# Patient Record
Sex: Male | Born: 1990
Health system: Southern US, Community
[De-identification: ages and names within clinical notes are randomized; demographics above are authoritative.]

## PROBLEM LIST (undated history)

## (undated) DIAGNOSIS — J45909 Unspecified asthma, uncomplicated: Secondary | ICD-10-CM

## (undated) HISTORY — PX: ABDOMINAL SURGERY: SHX537

---
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
## 2012-12-11 ENCOUNTER — Encounter (HOSPITAL_BASED_OUTPATIENT_CLINIC_OR_DEPARTMENT_OTHER): Payer: Self-pay | Admitting: Emergency Medicine

## 2012-12-11 ENCOUNTER — Emergency Department (HOSPITAL_BASED_OUTPATIENT_CLINIC_OR_DEPARTMENT_OTHER): Payer: Self-pay

## 2012-12-11 ENCOUNTER — Emergency Department (HOSPITAL_BASED_OUTPATIENT_CLINIC_OR_DEPARTMENT_OTHER)
Admission: EM | Admit: 2012-12-11 | Discharge: 2012-12-11 | Disposition: A | Discharge status: Home / Self Care | Payer: Self-pay | Attending: Emergency Medicine | Admitting: Emergency Medicine

## 2012-12-11 DIAGNOSIS — Z87891 Personal history of nicotine dependence: Secondary | ICD-10-CM | POA: Insufficient documentation

## 2012-12-11 DIAGNOSIS — R197 Diarrhea, unspecified: Secondary | ICD-10-CM | POA: Insufficient documentation

## 2012-12-11 DIAGNOSIS — Z9889 Other specified postprocedural states: Secondary | ICD-10-CM | POA: Insufficient documentation

## 2012-12-11 DIAGNOSIS — R109 Unspecified abdominal pain: Secondary | ICD-10-CM | POA: Insufficient documentation

## 2012-12-11 LAB — COMPREHENSIVE METABOLIC PANEL
ALT: 7 U/L (ref 0–53)
AST: 24 U/L (ref 0–37)
Albumin: 3.9 g/dL (ref 3.5–5.2)
Alkaline Phosphatase: 79 U/L (ref 39–117)
BUN: 12 mg/dL (ref 6–23)
CO2: 23 mEq/L (ref 19–32)
Calcium: 9 mg/dL (ref 8.4–10.5)
Chloride: 102 mEq/L (ref 96–112)
Creatinine, Ser: 1.2 mg/dL (ref 0.50–1.35)
GFR calc Af Amer: >90 mL/min (ref >90)
GFR calc non Af Amer: 85 mL/min — ABNORMAL LOW (ref >90)
Glucose, Bld: 110 mg/dL — ABNORMAL HIGH (ref 70–99)
Potassium: 3.8 mEq/L (ref 3.5–5.1)
Sodium: 135 mEq/L (ref 135–145)
Total Bilirubin: 0.4 mg/dL (ref 0.3–1.2)
Total Protein: 7.7 g/dL (ref 6.0–8.3)

## 2012-12-11 LAB — URINALYSIS, ROUTINE W REFLEX MICROSCOPIC
Bilirubin Urine: NEGATIVE
Glucose, UA: NEGATIVE mg/dL
Hgb urine dipstick: NEGATIVE
Ketones, ur: NEGATIVE mg/dL
Leukocytes, UA: NEGATIVE
Nitrite: NEGATIVE
Protein, ur: NEGATIVE mg/dL
Specific Gravity, Urine: 1.018 (ref 1.005–1.030)
Urobilinogen, UA: 0.2 mg/dL (ref 0.0–1.0)
pH: 5.5 (ref 5.0–8.0)

## 2012-12-11 LAB — CBC WITH DIFFERENTIAL/PLATELET
Basophils Absolute: 0 10*3/uL (ref 0.0–0.1)
Basophils Relative: 0 % (ref 0–1)
Eosinophils Absolute: 0.3 10*3/uL (ref 0.0–0.7)
Eosinophils Relative: 4 % (ref 0–5)
HCT: 43 % (ref 39.0–52.0)
Hemoglobin: 15 g/dL (ref 13.0–17.0)
Lymphocytes Relative: 31 % (ref 12–46)
Lymphs Abs: 2.2 10*3/uL (ref 0.7–4.0)
MCH: 28.7 pg (ref 26.0–34.0)
MCHC: 34.9 g/dL (ref 30.0–36.0)
MCV: 82.2 fL (ref 78.0–100.0)
Monocytes Absolute: 0.8 10*3/uL (ref 0.1–1.0)
Monocytes Relative: 11 % (ref 3–12)
Neutro Abs: 3.9 10*3/uL (ref 1.7–7.7)
Neutrophils Relative %: 54 % (ref 43–77)
Platelets: 292 10*3/uL (ref 150–400)
RBC: 5.23 MIL/uL (ref 4.22–5.81)
RDW: 11 % — ABNORMAL LOW (ref 11.5–15.5)
WBC: 7.3 10*3/uL (ref 4.0–10.5)

## 2012-12-11 LAB — LIPASE, BLOOD: Lipase: 32 U/L (ref 11–59)

## 2012-12-11 MED ORDER — GI COCKTAIL ~~LOC~~
30.0000 mL | Freq: Once | ORAL | Status: AC
  Administered 2012-12-11: 30 mL via ORAL

## 2012-12-11 MED ORDER — GI COCKTAIL ~~LOC~~
ORAL | Status: AC
  Filled 2012-12-11: qty 30

## 2012-12-11 MED ORDER — IOHEXOL 300 MG/ML  SOLN
50.0000 mL | Freq: Once | INTRAMUSCULAR | Status: AC | PRN
  Administered 2012-12-11: 50 mL via ORAL

## 2012-12-11 MED ORDER — LANSOPRAZOLE 30 MG PO CPDR: 30.0000 mg | DELAYED_RELEASE_CAPSULE | Freq: Every day | ORAL | Status: AC

## 2012-12-11 MED ORDER — IOHEXOL 300 MG/ML  SOLN
100.0000 mL | Freq: Once | INTRAMUSCULAR | Status: AC | PRN
  Administered 2012-12-11: 100 mL via INTRAVENOUS

## 2012-12-11 NOTE — ED Notes (Signed)
Pt. Reports mid abd. Pain started approx. 4 days ago.  No vomiting and reports soft stools more often than normal.

## 2012-12-11 NOTE — ED Provider Notes (Signed)
CSN: [id number]     Arrival date & time 12/11/12  0037 History   First MD Initiated Contact with Patient 12/11/12 0058     Chief Complaint  Patient presents with  . Abdominal Pain   (Consider location/radiation/quality/duration/timing/severity/associated sxs/prior Treatment) HPI Patient is a 22 year old male who presents with upper abdominal pain for the past 4 days. The pain is worse when he eating. He has had no nausea or vomiting. He has had several loose stools. He denies fever or chills. Patient states he was born premature and had an abdominal surgery as an infant but does not know the exact nature of the surgery. He states he took some ibuprofen for the pain which did not help. He periodically takes NSAIDs for headaches and minor pain.  History reviewed. No pertinent past medical history. Past Surgical History  Procedure Laterality Date  . Abdominal surgery      at birth per Pt.  intestinal surgery   No family history on file. History  Substance Use Topics  . Smoking status: Former Games developer  . Smokeless tobacco: Not on file  . Alcohol Use: No    Review of Systems  Constitutional: Negative for fever and chills.  Respiratory: Negative for shortness of breath.   Gastrointestinal: Positive for abdominal pain and diarrhea. Negative for nausea, vomiting, constipation and blood in stool.  Genitourinary: Negative for dysuria, frequency and flank pain.  Musculoskeletal: Negative for back pain.  Skin: Negative for rash and wound.  Neurological: Negative for dizziness, weakness, light-headedness, numbness and headaches.  All other systems reviewed and are negative.    Allergies  Review of patient's allergies indicates no known allergies.  Home Medications  No current outpatient prescriptions on file. BP 151/78  Pulse 76  Temp(Src) 98.4 F (36.9 C) (Oral)  Resp 18  Ht 5\' 7"  (1.702 m)  Wt 181 lb (82.101 kg)  BMI 28.34 kg/m2  SpO2 98% Physical Exam  Nursing note and vitals  reviewed. Constitutional: He is oriented to person, place, and time. He appears well-developed and well-nourished. No distress.  HENT:  Head: Normocephalic and atraumatic.  Mouth/Throat: Oropharynx is clear and moist.  Eyes: EOM are normal. Pupils are equal, round, and reactive to light.  Neck: Normal range of motion. Neck supple.  Cardiovascular: Normal rate and regular rhythm.   Pulmonary/Chest: Effort normal and breath sounds normal. No respiratory distress. He has no wheezes. He has no rales.  Abdominal: Soft. Bowel sounds are normal. He exhibits distension (mild abdominal distention.). He exhibits no mass. There is tenderness (patient with tenderness to palpation in the epigastric region. No rebound or guarding.). There is no rebound and no guarding.  Musculoskeletal: Normal range of motion. He exhibits no edema and no tenderness.  Neurological: He is alert and oriented to person, place, and time.  Skin: Skin is warm and dry. No rash noted. No erythema.  Psychiatric: He has a normal mood and affect. His behavior is normal.    ED Course  Procedures (including critical care time) Labs Review Labs Reviewed  URINALYSIS, ROUTINE W REFLEX MICROSCOPIC   Imaging Review No results found.  EKG Interpretation   None       MDM    Patient is resting comfortably. Abdomen remained remained soft and there is mild improvement with the GI cocktail. CT shows nonspecific changes. Patient been informed of these results and advised to followup with a gastroenterologist should his symptoms persist. Question whether symptoms related to gastritis. Will start on PPI and advise over-the-counter  Mylanta. Patient urged to return for worsening pain, persistent vomiting, fever, blood in stool or for any concerns.  Loren Racer, MD 12/11/12 [phone]

## 2012-12-11 NOTE — ED Notes (Signed)
Patient transported to X-ray 

## 2012-12-11 NOTE — ED Notes (Signed)
Patient transported to CT 

## 2012-12-11 NOTE — ED Notes (Signed)
MD at bedside. 

## 2013-11-20 ENCOUNTER — Encounter (HOSPITAL_BASED_OUTPATIENT_CLINIC_OR_DEPARTMENT_OTHER): Payer: Self-pay | Admitting: *Deleted

## 2013-11-20 ENCOUNTER — Emergency Department (HOSPITAL_BASED_OUTPATIENT_CLINIC_OR_DEPARTMENT_OTHER)
Admission: EM | Admit: 2013-11-20 | Discharge: 2013-11-20 | Disposition: A | Discharge status: Home / Self Care | Payer: Self-pay | Attending: Emergency Medicine | Admitting: Emergency Medicine

## 2013-11-20 ENCOUNTER — Emergency Department (HOSPITAL_BASED_OUTPATIENT_CLINIC_OR_DEPARTMENT_OTHER): Payer: Self-pay

## 2013-11-20 DIAGNOSIS — Z79899 Other long term (current) drug therapy: Secondary | ICD-10-CM | POA: Insufficient documentation

## 2013-11-20 DIAGNOSIS — Z87891 Personal history of nicotine dependence: Secondary | ICD-10-CM | POA: Insufficient documentation

## 2013-11-20 DIAGNOSIS — R52 Pain, unspecified: Secondary | ICD-10-CM

## 2013-11-20 DIAGNOSIS — M25522 Pain in left elbow: Secondary | ICD-10-CM

## 2013-11-20 DIAGNOSIS — Y9389 Activity, other specified: Secondary | ICD-10-CM | POA: Insufficient documentation

## 2013-11-20 DIAGNOSIS — Y9289 Other specified places as the place of occurrence of the external cause: Secondary | ICD-10-CM | POA: Insufficient documentation

## 2013-11-20 DIAGNOSIS — S59902A Unspecified injury of left elbow, initial encounter: Secondary | ICD-10-CM | POA: Insufficient documentation

## 2013-11-20 DIAGNOSIS — W2209XA Striking against other stationary object, initial encounter: Secondary | ICD-10-CM | POA: Insufficient documentation

## 2013-11-20 MED ORDER — TRAMADOL HCL 50 MG PO TABS: 50.0000 mg | ORAL_TABLET | Freq: Four times a day (QID) | ORAL | Status: AC | PRN

## 2013-11-20 NOTE — ED Provider Notes (Signed)
CSN: [id number]     Arrival date & time 11/20/13  1435 History   First MD Initiated Contact with Patient 11/20/13 1446     Chief Complaint  Patient presents with  . Elbow Injury     (Consider location/radiation/quality/duration/timing/severity/associated sxs/prior Treatment) HPI Comments: Pt comes in today with pain to his right shoulder after hitting it on a window pain. Pt states that he is getting shooting pains doing his  Forearm on the lateral side. He states that he thinks it is a little swollen. Denies previous injury  The history is provided by the patient. No language interpreter was used.    History reviewed. No pertinent past medical history. Past Surgical History  Procedure Laterality Date  . Abdominal surgery      at birth per Pt.  intestinal surgery   No family history on file. History  Substance Use Topics  . Smoking status: Former Games developermoker  . Smokeless tobacco: Not on file  . Alcohol Use: Yes    Review of Systems  All other systems reviewed and are negative.     Allergies  Review of patient's allergies indicates no known allergies.  Home Medications   Prior to Admission medications   Medication Sig Start Date End Date Taking? Authorizing Provider  lansoprazole (PREVACID) 30 MG capsule Take 1 capsule (30 mg total) by mouth daily at 12 noon. 12/11/12   Loren Raceravid Yelverton, MD   BP 167/90 mmHg  Pulse 84  Temp(Src) 98.2 F (36.8 C) (Oral)  Resp 20  Ht 5\' 7"  (1.702 m)  Wt 205 lb (92.987 kg)  BMI 32.10 kg/m2  SpO2 99% Physical Exam  Constitutional: He is oriented to person, place, and time. He appears well-developed and well-nourished.  Cardiovascular: Normal rate and regular rhythm.   Pulmonary/Chest: Effort normal and breath sounds normal.  Musculoskeletal:  Tender in the upper right upper on the lateral side. Full rom. Pulses intact  Neurological: He is alert and oriented to person, place, and time.  Skin: Skin is warm and dry.  Nursing note and  vitals reviewed.   ED Course  Procedures (including critical care time) Labs Review Labs Reviewed - No data to display  Imaging Review Dg Elbow Complete Left  11/20/2013   CLINICAL DATA:  Left level pain after hitting window. Initial encounter  EXAM: LEFT ELBOW - COMPLETE 3+ VIEW  COMPARISON:  None.  FINDINGS: There is no evidence of fracture, dislocation, or joint effusion. There is no evidence of arthropathy or other focal bone abnormality. Soft tissues are unremarkable.  IMPRESSION: Negative.   Electronically Signed   By: Tiburcio PeaJonathan  Watts M.D.   On: 11/20/2013 15:16     EKG Interpretation None      MDM   Final diagnoses:  Pain  Elbow pain, left    No bony abnormality noted. Will treat with ultram for pain. Given referral to DR. Vivi Barrackhudnall    Raeann Offner, NP 11/20/13 1523  Purvis SheffieldForrest Harrison, MD 11/20/13 1526

## 2013-11-20 NOTE — Discharge Instructions (Signed)
You can get a band at the pharmacy for continued symptoms Arthralgia Your caregiver has diagnosed you as suffering from an arthralgia. Arthralgia means there is pain in a joint. This can come from many reasons including:  Bruising the joint which causes soreness (inflammation) in the joint.  Wear and tear on the joints which occur as we grow older (osteoarthritis).  Overusing the joint.  Various forms of arthritis.  Infections of the joint. Regardless of the cause of pain in your joint, most of these different pains respond to anti-inflammatory drugs and rest. The exception to this is when a joint is infected, and these cases are treated with antibiotics, if it is a bacterial infection. HOME CARE INSTRUCTIONS   Rest the injured area for as long as directed by your caregiver. Then slowly start using the joint as directed by your caregiver and as the pain allows. Crutches as directed may be useful if the ankles, knees or hips are involved. If the knee was splinted or casted, continue use and care as directed. If an stretchy or elastic wrapping bandage has been applied today, it should be removed and re-applied every 3 to 4 hours. It should not be applied tightly, but firmly enough to keep swelling down. Watch toes and feet for swelling, bluish discoloration, coldness, numbness or excessive pain. If any of these problems (symptoms) occur, remove the ace bandage and re-apply more loosely. If these symptoms persist, contact your caregiver or return to this location.  For the first 24 hours, keep the injured extremity elevated on pillows while lying down.  Apply ice for 15-20 minutes to the sore joint every couple hours while awake for the first half day. Then 03-04 times per day for the first 48 hours. Put the ice in a plastic bag and place a towel between the bag of ice and your skin.  Wear any splinting, casting, elastic bandage applications, or slings as instructed.  Only take over-the-counter  or prescription medicines for pain, discomfort, or fever as directed by your caregiver. Do not use aspirin immediately after the injury unless instructed by your physician. Aspirin can cause increased bleeding and bruising of the tissues.  If you were given crutches, continue to use them as instructed and do not resume weight bearing on the sore joint until instructed. Persistent pain and inability to use the sore joint as directed for more than 2 to 3 days are warning signs indicating that you should see a caregiver for a follow-up visit as soon as possible. Initially, a hairline fracture (break in bone) may not be evident on X-rays. Persistent pain and swelling indicate that further evaluation, non-weight bearing or use of the joint (use of crutches or slings as instructed), or further X-rays are indicated. X-rays may sometimes not show a small fracture until a week or 10 days later. Make a follow-up appointment with your own caregiver or one to whom we have referred you. A radiologist (specialist in reading X-rays) may read your X-rays. Make sure you know how you are to obtain your X-ray results. Do not assume everything is normal if you do not hear from us. SEEK MEDICAL CARE IF: Bruising, swelling, or pain increases. SEEK IMMEDIATE MEDICAL CARE IF:   Your fingers or toes are numb or blue.  The pain is not responding to medications and continues to stay the same or get worse.  The pain in your joint becomes severe.  You develop a fever over 102 F (38.9 C).  It becomes  impossible to move or use the joint. MAKE SURE YOU:   Understand these instructions.  Will watch your condition.  Will get help right away if you are not doing well or get worse. Document Released: 01/02/2005 Document Revised: 03/27/2011 Document Reviewed: 08/21/2007 [facility] Patient Information 2015 [employer], [place]. This information is not intended to replace advice given to you by your health care provider. Make sure you  discuss any questions you have with your health care provider.

## 2013-11-20 NOTE — ED Notes (Signed)
Hit his left elbow on a window pain 40 minutes ago. Painful.

## 2014-10-17 IMAGING — CR DG ABDOMEN ACUTE W/ 1V CHEST
3 series · 3 of 3 positions shown · non-contrast
Comparison: None.

CLINICAL DATA: Epigastric pain with nausea and diarrhea for 4 days.

EXAM:
ACUTE ABDOMEN SERIES (ABDOMEN 2 VIEW & CHEST 1 VIEW)

[w chest pa]
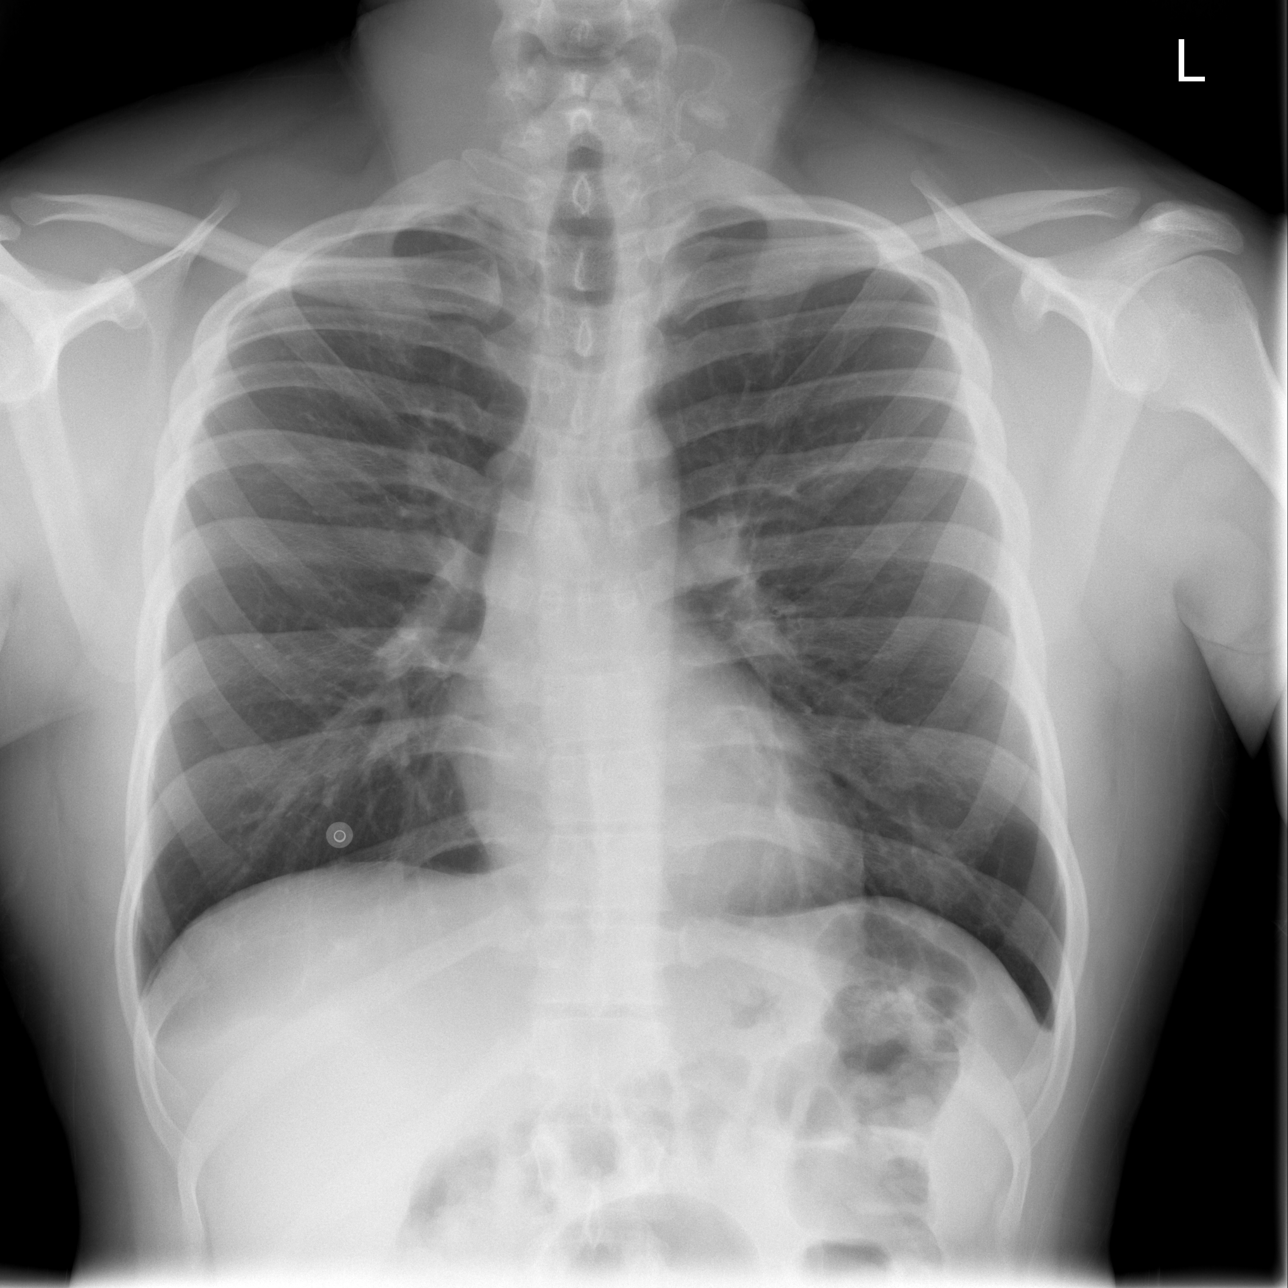

[w abdomen upright]
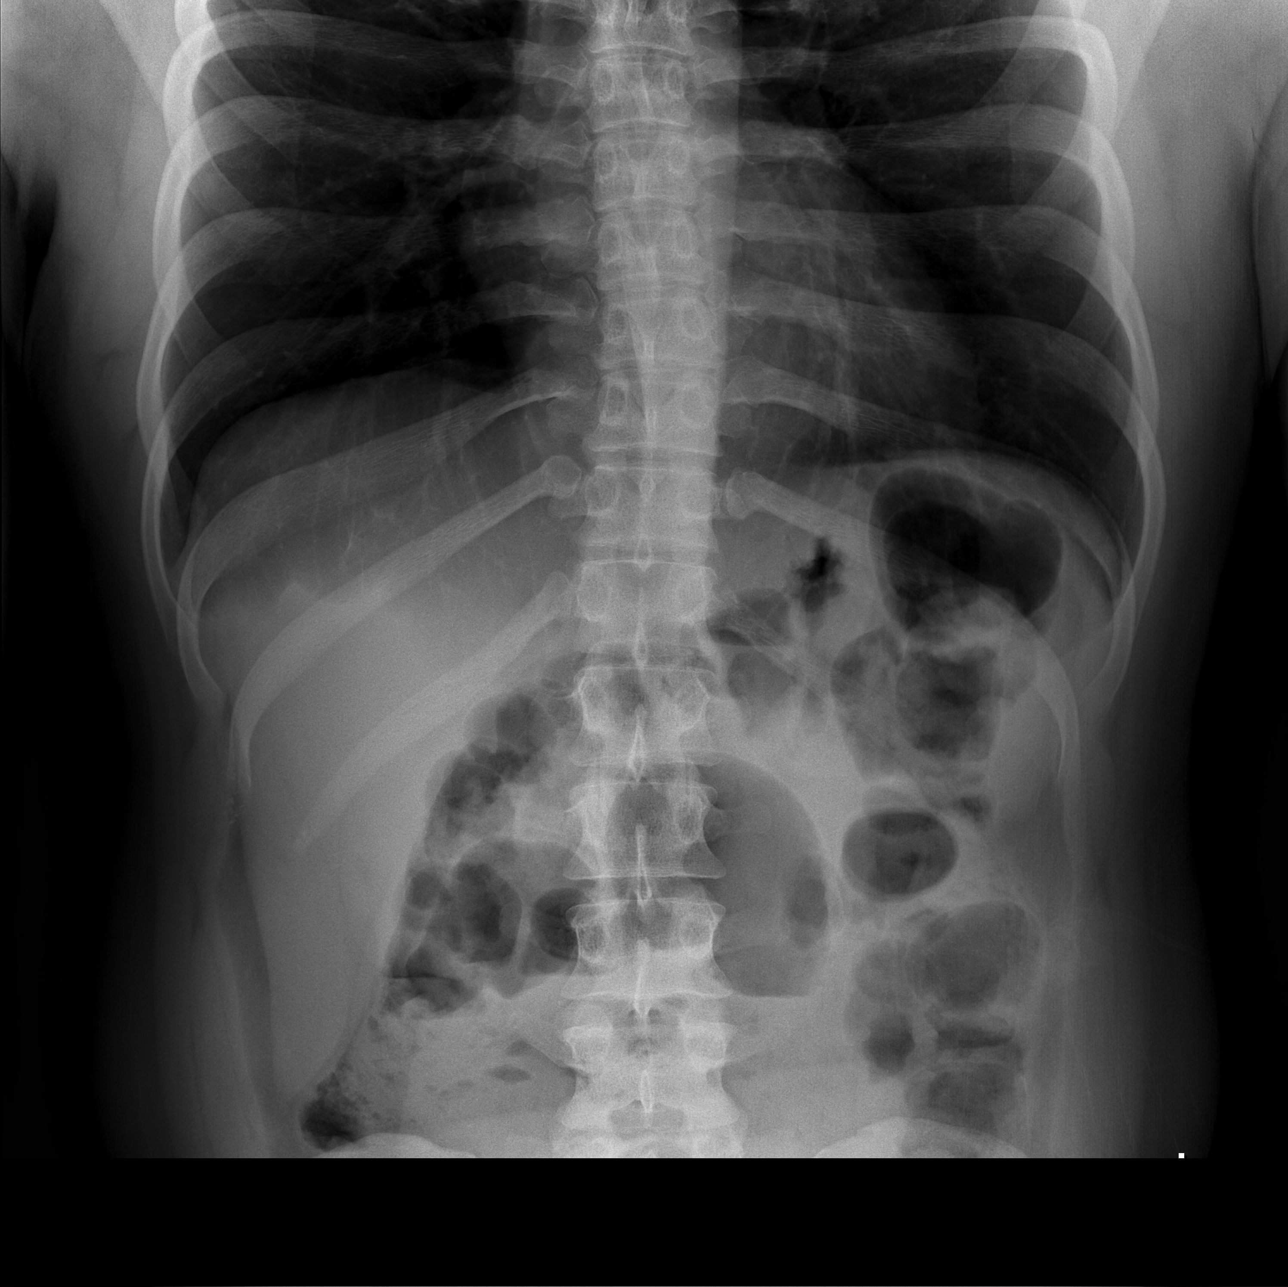

[t abdomen supine]
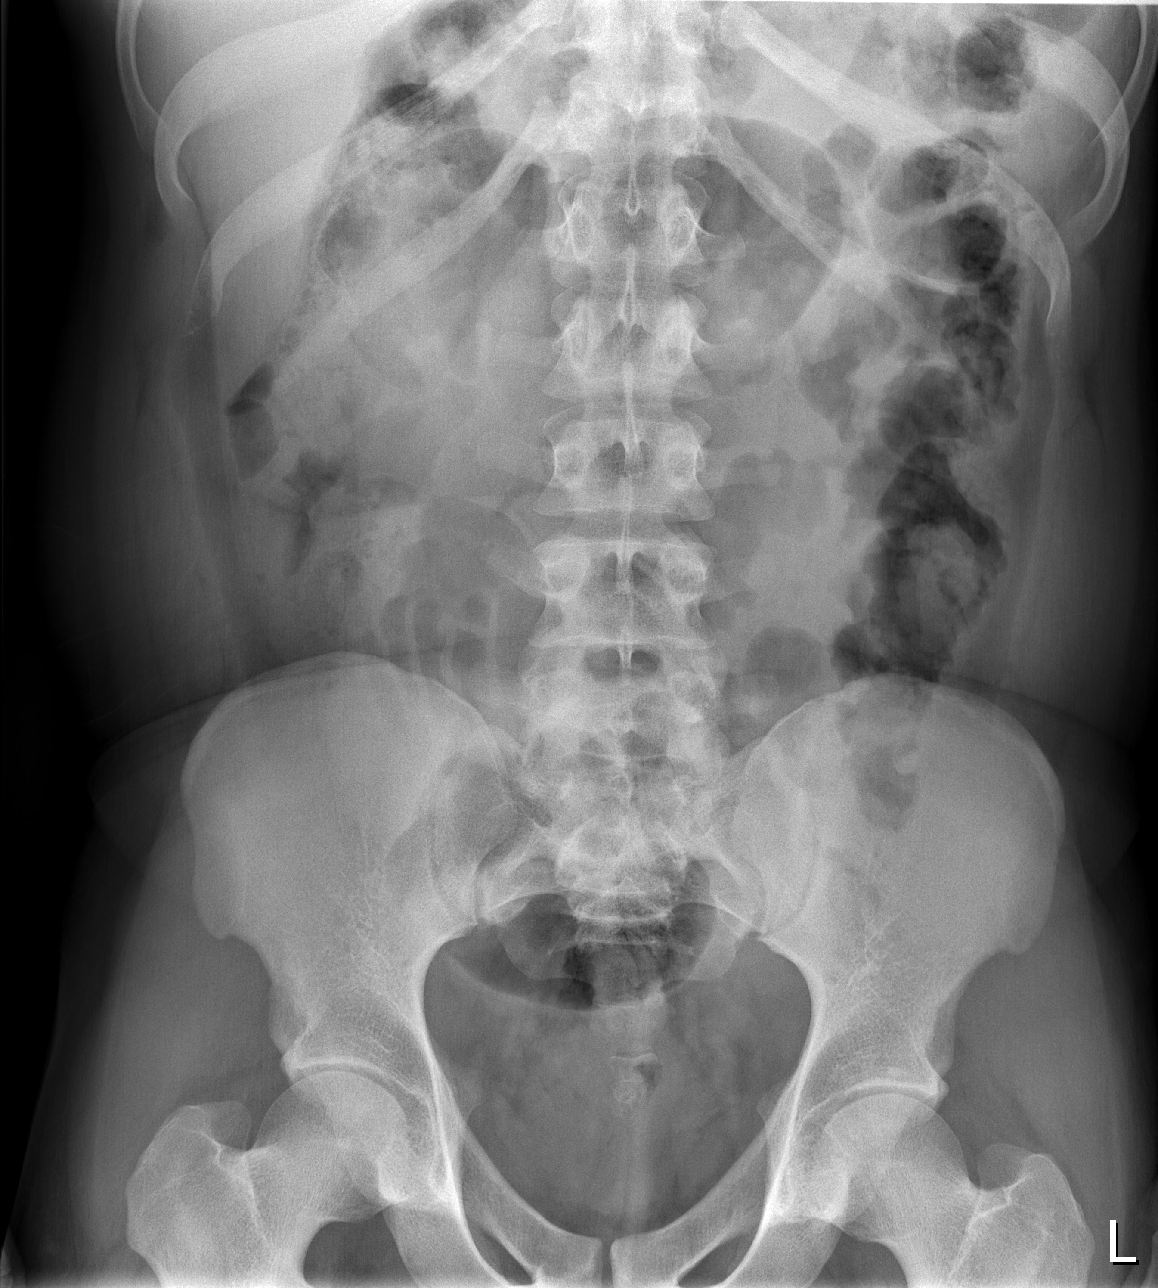

[3 of 3 positions shown; findings below may reference images not displayed]

FINDINGS: Normal heart size and pulmonary vascularity. No focal airspace
disease or consolidation in the lungs. No blunting of costophrenic
angles.

Gas and stool in the colon with gas-filled small bowel loops
centrally. No small or large bowel distention. No free
intra-abdominal air. There a few mid abdominal air-fluid levels,
likely within the sigmoid colon and suggesting liquid stool. The
colonic bowel gas pattern in the descending colon suggests colonic
wall thickening. Changes may be due to colitis. No radiopaque
stones. Visualized bones appear intact.
IMPRESSION: No evidence of active pulmonary disease. Nonobstructive bowel gas
pattern. Suggestion of liquid stool in the sigmoid colon. Suggestion
of thickening of the wall of the descending colon which may indicate
evidence of colitis.

## 2015-05-20 ENCOUNTER — Emergency Department (HOSPITAL_BASED_OUTPATIENT_CLINIC_OR_DEPARTMENT_OTHER)
Admission: EM | Admit: 2015-05-20 | Discharge: 2015-05-20 | Disposition: A | Discharge status: Home / Self Care | Payer: Self-pay | Attending: Emergency Medicine | Admitting: Emergency Medicine

## 2015-05-20 ENCOUNTER — Encounter (HOSPITAL_BASED_OUTPATIENT_CLINIC_OR_DEPARTMENT_OTHER): Payer: Self-pay | Admitting: *Deleted

## 2015-05-20 ENCOUNTER — Emergency Department (HOSPITAL_BASED_OUTPATIENT_CLINIC_OR_DEPARTMENT_OTHER): Payer: Self-pay

## 2015-05-20 DIAGNOSIS — M545 Low back pain: Secondary | ICD-10-CM | POA: Insufficient documentation

## 2015-05-20 DIAGNOSIS — Z87891 Personal history of nicotine dependence: Secondary | ICD-10-CM | POA: Insufficient documentation

## 2015-05-20 LAB — URINALYSIS, ROUTINE W REFLEX MICROSCOPIC
Bilirubin Urine: NEGATIVE
GLUCOSE, UA: NEGATIVE mg/dL
Hgb urine dipstick: NEGATIVE
KETONES UR: NEGATIVE mg/dL
Leukocytes, UA: NEGATIVE
Nitrite: NEGATIVE
PROTEIN: NEGATIVE mg/dL
Specific Gravity, Urine: 1.022 (ref 1.005–1.030)
pH: 6.5 (ref 5.0–8.0)

## 2015-05-20 MED ORDER — NAPROXEN 500 MG PO TABS: 500.0000 mg | ORAL_TABLET | Freq: Two times a day (BID) | ORAL | Status: AC

## 2015-05-20 MED FILL — NAPROXEN 500 MG TABLET: 500 | 15 days supply | Qty: 30 | Fill #0

## 2015-05-20 NOTE — Discharge Instructions (Signed)

## 2015-05-20 NOTE — ED Notes (Signed)
Lower back pain x 3 months. States he feels it is from stretching.

## 2015-05-20 NOTE — ED Provider Notes (Signed)
CSN: [id number]     Arrival date & time 05/20/15  1352 History   First MD Initiated Contact with Patient 05/20/15 1440     Chief Complaint  Patient presents with  . Back Pain     (Consider location/radiation/quality/duration/timing/severity/associated sxs/prior Treatment) HPI Comments: Patient reports lower back pain ongoing for 4-5 months. States pain is constant. He has not taken anything for it. Pain is severe and unrelenting. Nothing makes it better or worse.  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 Denies any radiation of the pain down his legs does wrap around his side. No focal weakness, numbness or tingling. Comments. No fever. No IV drug abuse. No history of cancer. He is not taking anything for this pain other than marijuana. No bowel or bladder incontinence.  Patient is a 25 y.o. male presenting with back pain. The history is provided by the patient.  Back Pain Associated symptoms: no abdominal pain, no chest pain, no dysuria, no fever, no headaches and no weakness     History reviewed. No pertinent past medical history. Past Surgical History  Procedure Laterality Date  . Abdominal surgery      at birth per Pt.  intestinal surgery   No family history on file. Social History  Substance Use Topics  . Smoking status: Former Games developermoker  . Smokeless tobacco: None  . Alcohol Use: Yes    Review of Systems  Constitutional: Negative for fever, activity change and appetite change.  HENT: Negative for congestion and rhinorrhea.   Respiratory: Negative for cough, chest tightness and shortness of breath.   Cardiovascular: Negative for chest pain.  Gastrointestinal: Negative for nausea, vomiting and abdominal pain.  Genitourinary: Negative for dysuria, urgency, hematuria, decreased urine volume and testicular pain.  Musculoskeletal: Positive for back pain. Negative for myalgias.  Skin: Negative for rash.  Neurological: Negative for dizziness, weakness,  light-headedness and headaches.  A complete 10 system review of systems was obtained and all systems are negative except as noted in the HPI and PMH.      Allergies  Review of patient's allergies indicates no known allergies.  Home Medications   Prior to Admission medications   Medication Sig Start Date End Date Taking? Authorizing Provider  lansoprazole (PREVACID) 30 MG capsule Take 1 capsule (30 mg total) by mouth daily at 12 noon. 12/11/12   Loren Raceravid Yelverton, MD  naproxen (NAPROSYN) 500 MG tablet Take 1 tablet (500 mg total) by mouth 2 (two) times daily. 05/20/15   Glynn OctaveStephen Cabell Lazenby, MD  traMADol (ULTRAM) 50 MG tablet Take 1 tablet (50 mg total) by mouth every 6 (six) hours as needed. 11/20/13   Teressa LowerVrinda Pickering, NP   BP 135/75 mmHg  Pulse 75  Temp(Src) 98.1 F (36.7 C) (Oral)  Resp 18  Ht 5\' 7"  (1.702 m)  Wt 205 lb (92.987 kg)  BMI 32.10 kg/m2  SpO2 98% Physical Exam  Constitutional: He is oriented to person, place, and time. He appears well-developed and well-nourished. No distress.  HENT:  Head: Normocephalic and atraumatic.  Mouth/Throat: Oropharynx is clear and moist. No oropharyngeal exudate.  Eyes: Conjunctivae and EOM are normal. Pupils are equal, round, and reactive to light.  Neck: Normal range of motion. Neck supple.  No meningismus.  Cardiovascular: Normal rate, regular rhythm, normal heart sounds and intact distal pulses.   No murmur heard. Pulmonary/Chest: Effort normal and breath sounds normal. No respiratory distress.  Abdominal: Soft. There is no tenderness. There is no rebound and no guarding.  Musculoskeletal: Normal range of motion. He exhibits tenderness. He exhibits no edema.  Diffuse paraspinal lumbar tenderness  5/5 strength in bilateral lower extremities. Ankle plantar and dorsiflexion intact. Great toe extension intact bilaterally. +2 DP and PT pulses. +2 patellar reflexes bilaterally. Normal gait.   Neurological: He is alert and oriented to person,  place, and time. No cranial nerve deficit. He exhibits normal muscle tone. Coordination normal.  No ataxia on finger to nose bilaterally. No pronator drift. 5/5 strength throughout. CN 2-12 intact.Equal grip strength. Sensation intact.   Skin: Skin is warm.  Psychiatric: He has a normal mood and affect. His behavior is normal.  Nursing note and vitals reviewed.   ED Course  Procedures (including critical care time) Labs Review Labs Reviewed  URINALYSIS, ROUTINE W REFLEX MICROSCOPIC (NOT AT [facility])    Imaging Review Dg Lumbar Spine Complete  05/20/2015  CLINICAL DATA:  Pt c/o lower back pain for 3 weeks, pt states he felt a pull and a pop about 3 weeks ago and now his back feels strained, tired and painful all the time EXAM: LUMBAR SPINE - COMPLETE 4+ VIEW COMPARISON:  CT, 12/11/2012 FINDINGS: There is no evidence of lumbar spine fracture. Alignment is normal. Intervertebral disc spaces are maintained. IMPRESSION: Negative. Electronically Signed   By: Amie Portland M.D.   On: 05/20/2015 15:37   I have personally reviewed and evaluated these images and lab results as part of my medical decision-making.   EKG Interpretation None      MDM   Final diagnoses:  Low back pain without sciatica, unspecified back pain laterality   4 months of constant low back pain without trauma. No focal neurological deficits. No incontinence.  Normal neurological exam. Urinalysis obtained in triage is negative. X-rays negative.  Treat supportively for Musculoskeletal back pain. No evidence of cord compression or cauda equina. Establish care with PCP. Return precautions discussed.    Glynn Octave, MD 05/20/15 [phone]

## 2015-08-08 ENCOUNTER — Emergency Department (HOSPITAL_BASED_OUTPATIENT_CLINIC_OR_DEPARTMENT_OTHER)
Admission: EM | Admit: 2015-08-08 | Discharge: 2015-08-09 | Disposition: A | Discharge status: Home / Self Care | Payer: No Typology Code available for payment source | Attending: Emergency Medicine | Admitting: Emergency Medicine

## 2015-08-08 ENCOUNTER — Encounter (HOSPITAL_BASED_OUTPATIENT_CLINIC_OR_DEPARTMENT_OTHER): Payer: Self-pay | Admitting: *Deleted

## 2015-08-08 DIAGNOSIS — Z87891 Personal history of nicotine dependence: Secondary | ICD-10-CM | POA: Insufficient documentation

## 2015-08-08 DIAGNOSIS — M546 Pain in thoracic spine: Secondary | ICD-10-CM | POA: Insufficient documentation

## 2015-08-08 DIAGNOSIS — Y939 Activity, unspecified: Secondary | ICD-10-CM | POA: Diagnosis not present

## 2015-08-08 DIAGNOSIS — Y92410 Unspecified street and highway as the place of occurrence of the external cause: Secondary | ICD-10-CM | POA: Insufficient documentation

## 2015-08-08 DIAGNOSIS — M25562 Pain in left knee: Secondary | ICD-10-CM | POA: Diagnosis not present

## 2015-08-08 DIAGNOSIS — M542 Cervicalgia: Secondary | ICD-10-CM | POA: Diagnosis not present

## 2015-08-08 DIAGNOSIS — Y999 Unspecified external cause status: Secondary | ICD-10-CM | POA: Diagnosis not present

## 2015-08-08 NOTE — ED Notes (Signed)
Pt called for triage x2 
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
## 2015-08-09 ENCOUNTER — Emergency Department (HOSPITAL_BASED_OUTPATIENT_CLINIC_OR_DEPARTMENT_OTHER): Payer: No Typology Code available for payment source

## 2015-08-09 MED ORDER — NAPROXEN 250 MG PO TABS
500.0000 mg | ORAL_TABLET | Freq: Once | ORAL | Status: AC
  Administered 2015-08-09: 500 mg via ORAL
  Filled 2015-08-09: qty 2

## 2015-08-09 MED ORDER — OXYCODONE-ACETAMINOPHEN 5-325 MG PO TABS
1.0000 | ORAL_TABLET | Freq: Once | ORAL | Status: AC
  Administered 2015-08-09: 1 via ORAL
  Filled 2015-08-09: qty 1
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
## 2017-12-08 ENCOUNTER — Other Ambulatory Visit: Payer: Self-pay

## 2017-12-08 ENCOUNTER — Encounter (HOSPITAL_BASED_OUTPATIENT_CLINIC_OR_DEPARTMENT_OTHER): Payer: Self-pay | Admitting: Emergency Medicine

## 2017-12-08 ENCOUNTER — Emergency Department (HOSPITAL_BASED_OUTPATIENT_CLINIC_OR_DEPARTMENT_OTHER)
Admission: EM | Admit: 2017-12-08 | Discharge: 2017-12-08 | Disposition: A | Discharge status: Home / Self Care | Payer: Self-pay | Attending: Emergency Medicine | Admitting: Emergency Medicine

## 2017-12-08 DIAGNOSIS — Z87891 Personal history of nicotine dependence: Secondary | ICD-10-CM | POA: Insufficient documentation

## 2017-12-08 DIAGNOSIS — J45909 Unspecified asthma, uncomplicated: Secondary | ICD-10-CM | POA: Insufficient documentation

## 2017-12-08 DIAGNOSIS — Z79899 Other long term (current) drug therapy: Secondary | ICD-10-CM | POA: Insufficient documentation

## 2017-12-08 DIAGNOSIS — A084 Viral intestinal infection, unspecified: Secondary | ICD-10-CM | POA: Insufficient documentation

## 2017-12-08 HISTORY — DX: Unspecified asthma, uncomplicated: J45.909

## 2017-12-08 LAB — URINALYSIS, ROUTINE W REFLEX MICROSCOPIC
Bilirubin Urine: NEGATIVE
Glucose, UA: NEGATIVE mg/dL
KETONES UR: NEGATIVE mg/dL
LEUKOCYTES UA: NEGATIVE
Nitrite: NEGATIVE
PH: 5.5 (ref 5.0–8.0)
Protein, ur: NEGATIVE mg/dL
SPECIFIC GRAVITY, URINE: 1.025 (ref 1.005–1.030)

## 2017-12-08 LAB — COMPREHENSIVE METABOLIC PANEL
ALT: 18 U/L (ref 0–44)
AST: 24 U/L (ref 15–41)
Albumin: 4.2 g/dL (ref 3.5–5.0)
Alkaline Phosphatase: 46 U/L (ref 38–126)
Anion gap: 8 (ref 5–15)
BUN: 16 mg/dL (ref 6–20)
CO2: 21 mmol/L — AB (ref 22–32)
Calcium: 9 mg/dL (ref 8.9–10.3)
Chloride: 104 mmol/L (ref 98–111)
Creatinine, Ser: 0.99 mg/dL (ref 0.61–1.24)
GFR calc Af Amer: >60 mL/min (ref >60)
GFR calc non Af Amer: >60 mL/min (ref >60)
Glucose, Bld: 131 mg/dL — ABNORMAL HIGH (ref 70–99)
Potassium: 4 mmol/L (ref 3.5–5.1)
SODIUM: 133 mmol/L — AB (ref 135–145)
Total Bilirubin: 0.6 mg/dL (ref 0.3–1.2)
Total Protein: 7.7 g/dL (ref 6.5–8.1)

## 2017-12-08 LAB — CBC
HCT: 50.5 % (ref 39.0–52.0)
Hemoglobin: 16.4 g/dL (ref 13.0–17.0)
MCH: 28.4 pg (ref 26.0–34.0)
MCHC: 32.5 g/dL (ref 30.0–36.0)
MCV: 87.4 fL (ref 80.0–100.0)
NRBC: 0 % (ref 0.0–0.2)
Platelets: 305 10*3/uL (ref 150–400)
RBC: 5.78 MIL/uL (ref 4.22–5.81)
RDW: 11.5 % (ref 11.5–15.5)
WBC: 6.6 10*3/uL (ref 4.0–10.5)

## 2017-12-08 LAB — URINALYSIS, MICROSCOPIC (REFLEX)

## 2017-12-08 LAB — LIPASE, BLOOD: LIPASE: 32 U/L (ref 11–51)

## 2017-12-08 MED ORDER — ONDANSETRON 4 MG PO TBDP: 4.0000 mg | ORAL_TABLET | Freq: Three times a day (TID) | ORAL | 0 refills | Status: AC | PRN

## 2017-12-08 MED ORDER — SODIUM CHLORIDE 0.9 % IV BOLUS
1000.0000 mL | Freq: Once | INTRAVENOUS | Status: AC
  Administered 2017-12-08: 1000 mL via INTRAVENOUS

## 2017-12-08 MED ORDER — MORPHINE SULFATE (PF) 2 MG/ML IV SOLN
2.0000 mg | Freq: Once | INTRAVENOUS | Status: AC
Start: 2017-12-08 — End: 2017-12-08
  Administered 2017-12-08: 2 mg via INTRAVENOUS
  Filled 2017-12-08: qty 1

## 2017-12-08 MED ORDER — ONDANSETRON HCL 4 MG/2ML IJ SOLN: 4.0000 mg | Freq: Once | INTRAMUSCULAR | Status: DC | PRN

## 2017-12-08 NOTE — ED Provider Notes (Signed)
[facility] EMERGENCY DEPARTMENT Provider Note   CSN: [id number] Arrival date & time: 12/08/17  1238     History   Chief Complaint Chief Complaint  Patient presents with  . Back Pain  . Emesis  . Diarrhea    HPI Patrick Avery is a 27 y.o. male presents today for concern of nausea/vomiting/diarrhea abdominal pain and generalized body aches.  Patient states that after awakening this morning he developed nonbloody nonbilious vomiting shortly followed by nonbloody diarrhea.  Patient states that vomiting and diarrhea continued over the course of a few hours during which he developed abdominal pain.  He describes his abdominal pain as a cramping pain that is diffuse, waxes and wanes, worse with vomiting and diarrhea.  Patient states that his pain is nearly gone when he has not actively vomiting.  Patient has not taken medication for his symptoms prior to arrival.  Patient also endorses generalized body aches that began this morning around the same time as the vomiting and diarrhea.  Patient denies focal area of pain and states that his generalized body aches are mild in intensity.  Patient states that he ate a bag of chips just prior to arrival without difficulty, nausea or vomiting.  Patient denies history of fever, cough, congestion, rhinorrhea, sore throat, chest pain/shortness of breath, rash or any other concerning symptoms today.  Patient states that he is an otherwise healthy 27 year old male without history of chronic medical conditions or daily medication use.  HPI  Past Medical History:  Diagnosis Date  . Asthma     There are no active problems to display for this patient.   Past Surgical History:  Procedure Laterality Date  . ABDOMINAL SURGERY     at birth per Pt.  intestinal surgery        Home Medications    Prior to Admission medications   Medication Sig Start Date End Date Taking? Authorizing Provider  lansoprazole (PREVACID) 30 MG capsule Take  1 capsule (30 mg total) by mouth daily at 12 noon. 12/11/12   Loren RacerYelverton, David, MD  naproxen (NAPROSYN) 500 MG tablet Take 1 tablet (500 mg total) by mouth 2 (two) times daily. 05/20/15   Rancour, Jeannett SeniorStephen, MD  ondansetron (ZOFRAN ODT) 4 MG disintegrating tablet Take 1 tablet (4 mg total) by mouth every 8 (eight) hours as needed for nausea or vomiting. 12/08/17   Harlene SaltsMorelli, Jossie Smoot A, PA-C  traMADol (ULTRAM) 50 MG tablet Take 1 tablet (50 mg total) by mouth every 6 (six) hours as needed. 11/20/13   Teressa LowerPickering, Vrinda, NP    Family History No family history on file.  Social History Social History   Tobacco Use  . Smoking status: Former Games developermoker  . Smokeless tobacco: Never Used  Substance Use Topics  . Alcohol use: Yes  . Drug use: Yes    Types: Marijuana     Allergies   Patient has no known allergies.   Review of Systems Review of Systems  Constitutional: Negative.  Negative for chills and fever.  HENT: Negative.  Negative for rhinorrhea and sore throat.   Eyes: Negative.  Negative for visual disturbance.  Respiratory: Negative.  Negative for cough and shortness of breath.   Cardiovascular: Negative.  Negative for chest pain.  Gastrointestinal: Positive for abdominal pain, diarrhea, nausea and vomiting. Negative for blood in stool.  Genitourinary: Negative.  Negative for dysuria and hematuria.  Musculoskeletal: Positive for arthralgias and myalgias.  Skin: Negative.  Negative for rash.  Neurological: Negative.  Negative for  dizziness, weakness and headaches.   Physical Exam Updated Vital Signs BP 128/72 (BP Location: Left Arm)   Pulse 96   Temp 99.6 F (37.6 C) (Oral)   Resp 18   Ht 5\' 7"  (1.702 m)   Wt 81.6 kg   SpO2 99%   BMI 28.19 kg/m   Physical Exam  Constitutional: He appears well-developed and well-nourished. No distress.  HENT:  Head: Normocephalic and atraumatic.  Right Ear: Hearing, tympanic membrane, external ear and ear canal normal.  Left Ear: Hearing,  tympanic membrane, external ear and ear canal normal.  Nose: Rhinorrhea present.  Mouth/Throat: Uvula is midline, oropharynx is clear and moist and mucous membranes are normal.  Eyes: Pupils are equal, round, and reactive to light. Conjunctivae and EOM are normal.  Neck: Trachea normal, normal range of motion, full passive range of motion without pain and phonation normal. Neck supple. No tracheal deviation present.  Cardiovascular: Normal rate, regular rhythm, normal heart sounds and intact distal pulses.  Pulses:      Radial pulses are 2+ on the right side, and 2+ on the left side.       Dorsalis pedis pulses are 2+ on the right side, and 2+ on the left side.       Posterior tibial pulses are 2+ on the right side, and 2+ on the left side.  Pulmonary/Chest: Effort normal and breath sounds normal. No respiratory distress. He has no decreased breath sounds. He has no wheezes. He exhibits no tenderness, no crepitus and no deformity.  Abdominal: Soft. He exhibits no distension. There is generalized tenderness. There is no rigidity, no rebound, no guarding, no CVA tenderness, no tenderness at McBurney's point and negative Murphy's sign.    Well-healed laparotomy scar patient states is due to surgery when he was an infant.  Musculoskeletal: Normal range of motion.       Right lower leg: Normal.       Left lower leg: Normal.  Feet:  Right Foot:  Protective Sensation: 3 sites tested. 3 sites sensed.  Left Foot:  Protective Sensation: 3 sites tested. 3 sites sensed.  Neurological: He is alert. GCS eye subscore is 4. GCS verbal subscore is 5. GCS motor subscore is 6.  Speech is clear and goal oriented, follows commands Major Cranial nerves without deficit, no facial droop Normal strength in upper and lower extremities bilaterally including dorsiflexion and plantar flexion, strong and equal grip strength Sensation normal to light touch Moves extremities without ataxia, coordination intact Normal  gait  Skin: Skin is warm and dry.  Psychiatric: He has a normal mood and affect. His behavior is normal.   ED Treatments / Results  Labs (all labs ordered are listed, but only abnormal results are displayed) Labs Reviewed  COMPREHENSIVE METABOLIC PANEL - Abnormal; Notable for the following components:      Result Value   Sodium 133 (*)    CO2 21 (*)    Glucose, Bld 131 (*)    All other components within normal limits  URINALYSIS, ROUTINE W REFLEX MICROSCOPIC - Abnormal; Notable for the following components:   Hgb urine dipstick LARGE (*)    All other components within normal limits  URINALYSIS, MICROSCOPIC (REFLEX) - Abnormal; Notable for the following components:   Bacteria, UA MANY (*)    All other components within normal limits  LIPASE, BLOOD  CBC    EKG None  Radiology No results found.  Procedures Procedures (including critical care time)  Medications Ordered in ED  Medications  sodium chloride 0.9 % bolus 1,000 mL (1,000 mLs Intravenous New Bag/Given 12/08/17 1417)  morphine 2 MG/ML injection 2 mg (2 mg Intravenous Given 12/08/17 1417)     Initial Impression / Assessment and Plan / ED Course  I have reviewed the triage vital signs and the nursing notes.  Pertinent labs & imaging results that were available during my care of the patient were reviewed by me and considered in my medical decision making (see chart for details).  Clinical Course as of Dec 08 1529  Sat Dec 08, 2017  1512 Patient reevaluated resting comfortably no acute distress.  Patient did not receive Zofran.  Patient is eating graham crackers and drinking ginger ale without difficulty or recurrence of nausea or vomiting.  States that he is ready to be discharged at this time.   [BM]    Clinical Course User Index [BM] Bill Salinas, PA-C   27 year old otherwise healthy male presenting for nausea/vomiting/diarrhea, mild abdominal pain and generalized body aches that began this  morning.  CBC within normal limits CMP nonacute Lipase within normal limits Urinalysis with bacteria however without urinary symptoms, no nitrates or leukocytes, doubt UTI  Patient is afebrile, non-toxic appearing, sitting comfortably on examination table. Patient's mild pain adequately managed in emergency department. Fluid bolus given. Patient does not meet the SIRS or Sepsis criteria.  On repeat exam patient does not have a surgical abdomen and there are no peritoneal signs, no abdominal pain on repeat examination.  No indication of appendicitis, bowel obstruction, bowel perforation, cholecystitis, diverticulitis or other acute intra-abdominal processes.  With clinical history and presentation suspect likely viral gastroenteritis today.  Patient is tolerating p.o. without difficulty.  Will discharge patient with ODT Zofran as needed for nausea/vomiting, encourage fluids intake and rest.  Encourage PCP follow-up within 72 hours.  At this time there does not appear to be any evidence of an acute emergency medical condition and the patient appears stable for discharge with appropriate outpatient follow up. Diagnosis was discussed with patient who verbalizes understanding of care plan and is agreeable to discharge. I have discussed return precautions with patient and his father at bedside who verbalize understanding of return precautions. Patient strongly encouraged to follow-up with their PCP. All questions answered.  Patient's case discussed with Dr. Patria Mane who agrees with plan to discharge, zofran and follow-up.   Note: Portions of this report may have been transcribed using voice recognition software. Every effort was made to ensure accuracy; however, inadvertent computerized transcription errors may still be present. Final Clinical Impressions(s) / ED Diagnoses   Final diagnoses:  Viral gastroenteritis    ED Discharge Orders         Ordered    ondansetron (ZOFRAN ODT) 4 MG disintegrating  tablet  Every 8 hours PRN     12/08/17 1529           Elizabeth Palau 12/08/17 1536    Azalia Bilis, MD 12/09/17 [phone]

## 2017-12-08 NOTE — Discharge Instructions (Signed)
You have been diagnosed today with viral gastroenteritis.  At this time there does not appear to be the presence of an emergent medical condition, however there is always the potential for conditions to change. Please read and follow the below instructions.  Please return to the Emergency Department immediately for any new or worsening symptoms or if your symptoms do not improve in the next 3 days. Please be sure to follow up with your Primary Care Provider within 3 days regarding your visit today; please call their office to schedule an appointment even if you are feeling better for a follow-up visit. You may use the medication Zofran as prescribed for nausea/vomiting.  This medication dissolves under your tongue. These drink plenty of water and get plenty of rest over the next few days to help with your symptoms.  Get help right away if: You have chest pain. You have Fever You feel extremely weak or you faint. You see blood in your vomit. Your vomit looks like coffee grounds. You have bloody or black stools or stools that look like tar. You have a severe headache, a stiff neck, or both. You have a rash. You have severe pain, cramping, or bloating in your abdomen. You have trouble breathing or you are breathing very quickly. Your heart is beating very quickly. Your skin feels cold and clammy. You feel confused. You have pain when you urinate. You have signs of dehydration, such as: Dark urine, very little urine, or no urine. Cracked lips. Dry mouth. Sunken eyes. Sleepiness. Weakness.  Please read the additional information packets attached to your discharge summary.  Do not take your medicine if  develop an itchy rash, swelling in your mouth or lips, or difficulty breathing.

## 2017-12-08 NOTE — ED Triage Notes (Signed)
Pt reports low back pain followed by N/V/D this morning.

## 2017-12-08 NOTE — ED Notes (Signed)
ED Provider at bedside. 

## 2020-02-25 DIAGNOSIS — M62830 Muscle spasm of back: Secondary | ICD-10-CM | POA: Diagnosis not present

## 2020-04-14 DIAGNOSIS — T22239A Burn of second degree of unspecified upper arm, initial encounter: Secondary | ICD-10-CM | POA: Diagnosis not present

## 2020-04-14 DIAGNOSIS — T22111A Burn of first degree of right forearm, initial encounter: Secondary | ICD-10-CM | POA: Diagnosis not present

## 2020-04-17 DIAGNOSIS — T22111D Burn of first degree of right forearm, subsequent encounter: Secondary | ICD-10-CM | POA: Diagnosis not present

## 2020-04-17 DIAGNOSIS — T22239D Burn of second degree of unspecified upper arm, subsequent encounter: Secondary | ICD-10-CM | POA: Diagnosis not present

## 2021-05-25 DIAGNOSIS — R1030 Lower abdominal pain, unspecified: Secondary | ICD-10-CM | POA: Diagnosis not present

## 2021-05-25 DIAGNOSIS — Z113 Encounter for screening for infections with a predominantly sexual mode of transmission: Secondary | ICD-10-CM | POA: Diagnosis not present

## 2021-05-25 DIAGNOSIS — M79671 Pain in right foot: Secondary | ICD-10-CM | POA: Diagnosis not present

## 2021-05-25 DIAGNOSIS — R0789 Other chest pain: Secondary | ICD-10-CM | POA: Diagnosis not present

## 2021-05-25 DIAGNOSIS — M25562 Pain in left knee: Secondary | ICD-10-CM | POA: Diagnosis not present

## 2021-05-25 DIAGNOSIS — G8929 Other chronic pain: Secondary | ICD-10-CM | POA: Diagnosis not present

## 2021-08-05 DIAGNOSIS — R102 Pelvic and perineal pain: Secondary | ICD-10-CM | POA: Diagnosis not present

## 2021-09-12 DIAGNOSIS — R1032 Left lower quadrant pain: Secondary | ICD-10-CM | POA: Diagnosis not present

## 2021-09-12 DIAGNOSIS — Z0000 Encounter for general adult medical examination without abnormal findings: Secondary | ICD-10-CM | POA: Diagnosis not present

## 2021-09-12 DIAGNOSIS — Z13220 Encounter for screening for lipoid disorders: Secondary | ICD-10-CM | POA: Diagnosis not present

## 2021-12-20 DIAGNOSIS — R21 Rash and other nonspecific skin eruption: Secondary | ICD-10-CM | POA: Diagnosis not present

## 2022-02-01 DIAGNOSIS — M545 Low back pain, unspecified: Secondary | ICD-10-CM | POA: Diagnosis not present

## 2023-08-28 ENCOUNTER — Emergency Department (HOSPITAL_BASED_OUTPATIENT_CLINIC_OR_DEPARTMENT_OTHER): Admission: EM | Admit: 2023-08-28 | Discharge: 2023-08-28 | Disposition: A | Discharge status: Home / Self Care

## 2023-08-28 ENCOUNTER — Other Ambulatory Visit (HOSPITAL_BASED_OUTPATIENT_CLINIC_OR_DEPARTMENT_OTHER): Payer: Self-pay

## 2023-08-28 ENCOUNTER — Encounter (HOSPITAL_BASED_OUTPATIENT_CLINIC_OR_DEPARTMENT_OTHER): Payer: Self-pay | Admitting: Urology

## 2023-08-28 ENCOUNTER — Emergency Department (HOSPITAL_BASED_OUTPATIENT_CLINIC_OR_DEPARTMENT_OTHER)

## 2023-08-28 ENCOUNTER — Other Ambulatory Visit: Payer: Self-pay

## 2023-08-28 DIAGNOSIS — M79604 Pain in right leg: Secondary | ICD-10-CM | POA: Diagnosis not present

## 2023-08-28 DIAGNOSIS — R079 Chest pain, unspecified: Secondary | ICD-10-CM

## 2023-08-28 DIAGNOSIS — M7731 Calcaneal spur, right foot: Secondary | ICD-10-CM | POA: Diagnosis not present

## 2023-08-28 DIAGNOSIS — M542 Cervicalgia: Secondary | ICD-10-CM | POA: Diagnosis not present

## 2023-08-28 DIAGNOSIS — R0789 Other chest pain: Secondary | ICD-10-CM | POA: Insufficient documentation

## 2023-08-28 MED ORDER — METHOCARBAMOL 500 MG PO TABS
500.0000 mg | ORAL_TABLET | Freq: Two times a day (BID) | ORAL | 0 refills | Status: AC | PRN
  Filled 2023-08-28: qty 20, 10d supply, fill #0

## 2023-08-28 MED ORDER — LIDOCAINE 5 % EX PTCH
1.0000 | MEDICATED_PATCH | Freq: Once | CUTANEOUS | Status: DC
  Administered 2023-08-28 (×2): 1 via TRANSDERMAL
  Filled 2023-08-28: qty 1

## 2023-08-28 MED ORDER — LIDOCAINE 5 % EX PTCH
1.0000 | MEDICATED_PATCH | CUTANEOUS | 0 refills | Status: AC
  Filled 2023-08-28: qty 14, 14d supply, fill #0

## 2023-08-28 NOTE — ED Triage Notes (Signed)
 Pt states chest pains x 2 months  And posterior neck pain that started 2 days ago after sleeping funny  Also reports right leg pain x 1 year, worse sense hitting right side of food

## 2023-08-28 NOTE — Discharge Instructions (Addendum)
 You may take over-the-counter medications such as Tylenol  alternating with ibuprofen doses as directed on the packaging alternating between the 2 every 4 hours for baseline pain control.  You may further use the lidocaine  patches and muscle relaxer for additional pain.  Please follow-up with your primary doctor for further evaluation of your symptoms.  Return if develop fevers, chills, lightheadedness, passout, weakness in your upper extremities, severe chest pain, shortness of breath weakness in your leg, numbness in your genital area or any new or worsening symptoms that are concerning to you.

## 2023-08-28 NOTE — ED Provider Notes (Signed)
 [place] EMERGENCY DEPARTMENT AT [facility] Provider Note   CSN: [id number] Arrival date & time: 08/28/23  1240     Patient presents with: Multiple Complaints    Patrick Avery is a 33 y.o. male.   33 year old male presenting emergency department for neck pain x 3 days.  Chest pain intermittently over the past several months and pinky toe pain x 1 year.  Neck pain started after sleeping on it wrong.  No trauma.  No neurosymptoms.  Reports some intermittent sharp chest pain in the last several seconds to minutes and resolved spontaneously.  Has been several days since he has had it.  No associated shortness of breath, nausea vomiting or lightheadedness.  His toe pain has been ongoing for the past year, hurts somewhat to walk on it.  Hit several days ago and is bothering him again.        Prior to Admission medications   Medication Sig Start Date End Date Taking? Authorizing Provider  lidocaine  (LIDODERM ) 5 % Place 1 (one) patch onto the skin daily. Remove and discard patch within 12 hours or as directed. 08/28/23  Yes Neysa Caron PARAS, DO  methocarbamol  (ROBAXIN ) 500 MG tablet Take 1 tablet (500 mg total) by mouth 2 (two) times daily as needed for up to 14 days for muscle spasms. 08/28/23 09/11/23 Yes Neysa Caron PARAS, DO  lansoprazole  (PREVACID ) 30 MG capsule Take 1 capsule (30 mg total) by mouth daily at 12 noon. 12/11/12   Carlyle Lenis, MD  naproxen  (NAPROSYN ) 500 MG tablet Take 1 tablet (500 mg total) by mouth 2 (two) times daily. 05/20/15   Rancour, Garnette, MD  ondansetron  (ZOFRAN  ODT) 4 MG disintegrating tablet Take 1 tablet (4 mg total) by mouth every 8 (eight) hours as needed for nausea or vomiting. 12/08/17   Donah Riis A, PA-C  traMADol  (ULTRAM ) 50 MG tablet Take 1 tablet (50 mg total) by mouth every 6 (six) hours as needed. 11/20/13   Pickering, Vrinda, NP    Allergies: Patient has no known allergies.    Review of Systems  Updated Vital Signs BP 123/80    Pulse 72   Temp 98.4 F (36.9 C) (Oral)   Resp 15   Ht 5' 7 (1.702 m)   Wt 81.6 kg   SpO2 96%   BMI 28.18 kg/m   Physical Exam Vitals and nursing note reviewed.  Constitutional:      General: He is not in acute distress.    Appearance: He is not toxic-appearing.  HENT:     Nose: Nose normal.  Eyes:     Conjunctiva/sclera: Conjunctivae normal.  Neck:     Comments: No midline spinal tenderness.  Does have tenderness to the paracervical musculature with some hypertonicity. Cardiovascular:     Rate and Rhythm: Normal rate and regular rhythm.  Pulmonary:     Effort: Pulmonary effort is normal.     Breath sounds: Normal breath sounds.  Abdominal:     General: Abdomen is flat. There is no distension.     Tenderness: There is no abdominal tenderness. There is no guarding or rebound.  Musculoskeletal:     Cervical back: Normal range of motion.     Comments: Pinky toe warm well-perfused.  2+ DP pulses.  No warmth or erythema.  Soft compartments in lower extremities.  Skin:    General: Skin is warm and dry.     Capillary Refill: Capillary refill takes less than 2 seconds.  Neurological:  Mental Status: He is alert and oriented to person, place, and time.  Psychiatric:        Mood and Affect: Mood normal.        Behavior: Behavior normal.     (all labs ordered are listed, but only abnormal results are displayed) Labs Reviewed - No data to display  EKG: None  Radiology: DG Foot Complete Right Result Date: 08/28/2023 CLINICAL DATA:  Right leg pain for 1 year worse after hitting right side of the foot. EXAM: RIGHT FOOT COMPLETE - 3+ VIEW COMPARISON:  None Available. FINDINGS: Image quality degraded by artifact. The mineralization and alignment are normal. There is no evidence of acute fracture or dislocation. The joint spaces are preserved. Mild posterior calcaneal spurring. The soft tissues appear unremarkable. IMPRESSION: No acute osseous findings or significant arthropathic  changes. Mild posterior calcaneal spurring. Electronically Signed   By: Elsie Perone M.D.   On: 08/28/2023 14:59   DG Chest 2 View Result Date: 08/28/2023 CLINICAL DATA:  Chest pain. EXAM: CHEST - 2 VIEW COMPARISON:  12/11/2012. FINDINGS: Normal heart, mediastinum and hila. Clear lungs.  No pleural effusion or pneumothorax. Skeletal structures are within normal limits. IMPRESSION: Normal chest radiographs. Electronically Signed   By: Alm Parkins M.D.   On: 08/28/2023 14:29     Procedures   Medications Ordered in the ED - No data to display                                   Medical Decision Making 33 year old male presenting emergency department for multiple complaints.  Neck pain without red flags.  Treat supportively with lidocaine  patch short course of muscle relaxers and Tylenol /NSAIDs.  Chest pain; asymptomatic currently.  Will get chest x-ray and EKG.  Will likely need to follow-up with PCP as I have low suspicion for ACS or cardiac etiology given age and risk factors.  Low risk for PE based on Wells criteria.  Will get x-ray of foot to evaluate for occult fracture given his report of recent trauma to the toe.  Xrs negative. Will discharge in stable condition.   Amount and/or Complexity of Data Reviewed Labs:     Details: Considered labs, however low suspicion for ACS as he is chest pain-free and has not had an chest pain in several days.  Troponin likely to be useful in this situation. Radiology: ordered and independent interpretation performed. ECG/medicine tests: ordered and independent interpretation performed.    Details: See ED course  Risk Prescription drug management. Decision regarding hospitalization. Diagnosis or treatment significantly limited by social determinants of health. Risk Details: Poor health literacy      Final diagnoses:  Neck pain  Chest pain, unspecified type    ED Discharge Orders          Ordered    lidocaine  (LIDODERM ) 5 %  Every 24  hours        08/28/23 1432    methocarbamol  (ROBAXIN ) 500 MG tablet  2 times daily PRN        08/28/23 1432               Neysa Caron PARAS, DO 08/29/23 [phone]

## 2023-09-06 ENCOUNTER — Emergency Department (HOSPITAL_BASED_OUTPATIENT_CLINIC_OR_DEPARTMENT_OTHER)
Admission: EM | Admit: 2023-09-06 | Discharge: 2023-09-07 | Disposition: A | Discharge status: Home / Self Care | Attending: Emergency Medicine | Admitting: Emergency Medicine

## 2023-09-06 ENCOUNTER — Encounter (HOSPITAL_BASED_OUTPATIENT_CLINIC_OR_DEPARTMENT_OTHER): Payer: Self-pay | Admitting: Emergency Medicine

## 2023-09-06 ENCOUNTER — Emergency Department (HOSPITAL_BASED_OUTPATIENT_CLINIC_OR_DEPARTMENT_OTHER): Admitting: Radiology

## 2023-09-06 ENCOUNTER — Other Ambulatory Visit: Payer: Self-pay

## 2023-09-06 DIAGNOSIS — R079 Chest pain, unspecified: Secondary | ICD-10-CM | POA: Diagnosis not present

## 2023-09-06 DIAGNOSIS — M5412 Radiculopathy, cervical region: Secondary | ICD-10-CM | POA: Diagnosis not present

## 2023-09-06 DIAGNOSIS — J45909 Unspecified asthma, uncomplicated: Secondary | ICD-10-CM | POA: Diagnosis not present

## 2023-09-06 DIAGNOSIS — M542 Cervicalgia: Secondary | ICD-10-CM | POA: Diagnosis not present

## 2023-09-06 LAB — CBC
HCT: 43.9 % (ref 39.0–52.0)
Hemoglobin: 14.5 g/dL (ref 13.0–17.0)
MCH: 28.5 pg (ref 26.0–34.0)
MCHC: 33 g/dL (ref 30.0–36.0)
MCV: 86.4 fL (ref 80.0–100.0)
Platelets: 313 K/uL (ref 150–400)
RBC: 5.08 MIL/uL (ref 4.22–5.81)
RDW: 11.7 % (ref 11.5–15.5)
WBC: 7.7 K/uL (ref 4.0–10.5)
nRBC: 0 % (ref 0.0–0.2)

## 2023-09-06 LAB — BASIC METABOLIC PANEL WITH GFR
Anion gap: 16 — ABNORMAL HIGH (ref 5–15)
BUN: 13 mg/dL (ref 6–20)
CO2: 22 mmol/L (ref 22–32)
Calcium: 10.2 mg/dL (ref 8.9–10.3)
Chloride: 100 mmol/L (ref 98–111)
Creatinine, Ser: 1.33 mg/dL — ABNORMAL HIGH (ref 0.61–1.24)
GFR, Estimated: >60 mL/min (ref >60)
Glucose, Bld: 156 mg/dL — ABNORMAL HIGH (ref 70–99)
Potassium: 3.6 mmol/L (ref 3.5–5.1)
Sodium: 138 mmol/L (ref 135–145)

## 2023-09-06 LAB — TROPONIN T, HIGH SENSITIVITY: Troponin T High Sensitivity: <15 ng/L (ref 0–19)

## 2023-09-06 NOTE — ED Triage Notes (Signed)
 Left side neck tingling-x2 weeks. No facial numbness or weakness. Right leg pain x several days. Cp x2 months- last episode today.

## 2023-09-07 ENCOUNTER — Emergency Department (HOSPITAL_BASED_OUTPATIENT_CLINIC_OR_DEPARTMENT_OTHER)

## 2023-09-07 ENCOUNTER — Other Ambulatory Visit (HOSPITAL_BASED_OUTPATIENT_CLINIC_OR_DEPARTMENT_OTHER): Payer: Self-pay

## 2023-09-07 DIAGNOSIS — M5412 Radiculopathy, cervical region: Secondary | ICD-10-CM | POA: Diagnosis not present

## 2023-09-07 DIAGNOSIS — R202 Paresthesia of skin: Secondary | ICD-10-CM | POA: Diagnosis not present

## 2023-09-07 MED ORDER — METHYLPREDNISOLONE 4 MG PO TBPK: ORAL_TABLET | ORAL | 0 refills | Status: AC

## 2023-09-07 MED ORDER — METHYLPREDNISOLONE 4 MG PO TBPK
ORAL_TABLET | ORAL | 0 refills | Status: DC
  Filled 2023-09-07: qty 21, fill #0

## 2023-09-07 MED ORDER — KETOROLAC TROMETHAMINE 30 MG/ML IJ SOLN
30.0000 mg | Freq: Once | INTRAMUSCULAR | Status: AC
  Administered 2023-09-07: 30 mg via INTRAMUSCULAR
  Filled 2023-09-07: qty 1

## 2023-09-07 NOTE — Discharge Instructions (Signed)
 You were seen today for neck pain that radiates down the arm.  It is consistent with radicular pain which is nerve pain.  Take medications as prescribed.  If not improving, follow-up with neurosurgery.

## 2023-09-07 NOTE — ED Provider Notes (Signed)
 [facility] Provider Note   CSN: [id number] Arrival date & time: 09/06/23  2134     Patient presents with: No chief complaint on file.   Patrick Avery is a 33 y.o. male.   HPI     Is a 33 year old male who presents with multiple complaints.  Mostly he states that over the last 1 to 2 weeks he has had increasing neck pain with pain that radiates down the left arm.  Denies weakness in that arm.  He is right-handed.  Denies injury or heavy lifting.  States the pain started after sleeping awkwardly.  He also reports chest pain that has been ongoing and episodic for the last 2 months.  Denies shortness of breath.  No exertional symptoms.  Prior to Admission medications   Medication Sig Start Date End Date Taking? Authorizing Provider  lansoprazole  (PREVACID ) 30 MG capsule Take 1 capsule (30 mg total) by mouth daily at 12 noon. 12/11/12   Carlyle Lenis, MD  lidocaine  (LIDODERM ) 5 % Place 1 (one) patch onto the skin daily. Remove and discard patch within 12 hours or as directed. 08/28/23   Neysa Caron PARAS, DO  methocarbamol  (ROBAXIN ) 500 MG tablet Take 1 tablet (500 mg total) by mouth 2 (two) times daily as needed for up to 14 days for muscle spasms. 08/28/23 09/11/23  Neysa Caron PARAS, DO  methylPREDNISolone  (MEDROL  DOSEPAK) 4 MG TBPK tablet Take as directed on packet 09/07/23   Rahmel Nedved, Charmaine FALCON, MD  naproxen  (NAPROSYN ) 500 MG tablet Take 1 tablet (500 mg total) by mouth 2 (two) times daily. 05/20/15   Rancour, Garnette, MD  ondansetron  (ZOFRAN  ODT) 4 MG disintegrating tablet Take 1 tablet (4 mg total) by mouth every 8 (eight) hours as needed for nausea or vomiting. 12/08/17   Donah Riis A, PA-C  traMADol  (ULTRAM ) 50 MG tablet Take 1 tablet (50 mg total) by mouth every 6 (six) hours as needed. 11/20/13   Pickering, Vrinda, NP    Allergies: Patient has no known allergies.    Review of Systems  Constitutional:  Negative for fever.  Respiratory:   Negative for shortness of breath.   Cardiovascular:  Positive for chest pain.  Musculoskeletal:  Positive for neck pain.  All other systems reviewed and are negative.   Updated Vital Signs BP (!) 144/90   Pulse 89   Temp 98.3 F (36.8 C) (Oral)   Resp 18   SpO2 97%   Physical Exam Vitals and nursing note reviewed.  Constitutional:      Appearance: He is well-developed.  HENT:     Head: Normocephalic and atraumatic.  Eyes:     Pupils: Pupils are equal, round, and reactive to light.  Cardiovascular:     Rate and Rhythm: Normal rate and regular rhythm.     Heart sounds: Normal heart sounds. No murmur heard. Pulmonary:     Effort: Pulmonary effort is normal. No respiratory distress.     Breath sounds: Normal breath sounds. No wheezing.  Abdominal:     Palpations: Abdomen is soft.     Tenderness: There is no abdominal tenderness. There is no rebound.  Musculoskeletal:     Cervical back: Normal range of motion and neck supple. No tenderness.  Lymphadenopathy:     Cervical: No cervical adenopathy.  Skin:    General: Skin is warm and dry.  Neurological:     Mental Status: He is alert and oriented to person, place, and time.  Comments: 5 out of 5 biceps, triceps, deltoid, and grip strength bilaterally     (all labs ordered are listed, but only abnormal results are displayed) Labs Reviewed  BASIC METABOLIC PANEL WITH GFR - Abnormal; Notable for the following components:      Result Value   Glucose, Bld 156 (*)    Creatinine, Ser 1.33 (*)    Anion gap 16 (*)    All other components within normal limits  CBC  TROPONIN T, HIGH SENSITIVITY    EKG: None  Radiology: CT Cervical Spine Wo Contrast Result Date: 09/07/2023 CLINICAL DATA:  Cervical radiculopathy, no red flags Neck pain, acute, no red flags. Left neck tingling. EXAM: CT CERVICAL SPINE WITHOUT CONTRAST TECHNIQUE: Multidetector CT imaging of the cervical spine was performed without intravenous contrast.  Multiplanar CT image reconstructions were also generated. RADIATION DOSE REDUCTION: This exam was performed according to the departmental dose-optimization program which includes automated exposure control, adjustment of the mA and/or kV according to patient size and/or use of iterative reconstruction technique. COMPARISON:  None Available. FINDINGS: Alignment: No subluxation Skull base and vertebrae: No acute fracture. No primary bone lesion or focal pathologic process. Soft tissues and spinal canal: No prevertebral fluid or swelling. No visible canal hematoma. Disc levels: Disc spaces maintained. No disc herniation. No neural foraminal narrowing or central spinal stenosis. Upper chest: Negative Other: None IMPRESSION: No acute or significant osseous abnormality. Electronically Signed   By: Franky Crease M.D.   On: 09/07/2023 01:21   DG Chest 2 View Result Date: 09/06/2023 CLINICAL DATA:  Chest pain on the left side EXAM: CHEST - 2 VIEW COMPARISON:  08/28/2023 FINDINGS: The heart size and mediastinal contours are within normal limits. Both lungs are clear. The visualized skeletal structures are unremarkable. IMPRESSION: No active cardiopulmonary disease. Electronically Signed   By: Norman Gatlin M.D.   On: 09/06/2023 22:22     Procedures   Medications Ordered in the ED  ketorolac  (TORADOL ) 30 MG/ML injection 30 mg (30 mg Intramuscular Given 09/07/23 0056)                                    Medical Decision Making Amount and/or Complexity of Data Reviewed Labs: ordered. Radiology: ordered.  Risk Prescription drug management.   This patient presents to the ED for concern of neck pain, chest pain, this involves an extensive number of treatment options, and is a complaint that carries with it a high risk of complications and morbidity.  I considered the following differential and admission for this acute, potentially life threatening condition.  The differential diagnosis includes radiculopathy  herniated disc ACS, PE, pneumothorax, pneumonia  MDM:    This is a 33 year old male who presents with primarily neck pain neurologically intact.  Character of pain seems radicular in nature.  Patient given Toradol .  Regarding chest pain, EKG without acute ischemic changes.  CBC and BMP are reassuring.  Troponin negative x 1.  Feel this is adequate.  Chest x-ray without pneumothorax or pneumonia.  CT cervical spine obtained and shows no acute abnormality.  Will treat for cervical radiculopathy.  Neurosurgery follow-up provided.  Medrol  Dosepak prescribed. (Labs, imaging, consults)  Labs: I Ordered, and personally interpreted labs.  The pertinent results include: CBC, BMP, troponin  Imaging Studies ordered: I ordered imaging studies including x-ray, CT cervical spine I independently visualized and interpreted imaging. I agree with the radiologist interpretation  Additional  history obtained from chart review.  External records from outside source obtained and reviewed including recent evaluation  Cardiac Monitoring: The patient was maintained on a cardiac monitor.  If on the cardiac monitor, I personally viewed and interpreted the cardiac monitored which showed an underlying rhythm of: Sinus rhythm  Reevaluation: After the interventions noted above, I reevaluated the patient and found that they have :improved  Social Determinants of Health:  lives independently  Disposition: Discharge  Co morbidities that complicate the patient evaluation  Past Medical History:  Diagnosis Date   Asthma      Medicines Meds ordered this encounter  Medications   ketorolac  (TORADOL ) 30 MG/ML injection 30 mg   DISCONTD: methylPREDNISolone  (MEDROL  DOSEPAK) 4 MG TBPK tablet    Sig: Take as directed on packet    Dispense:  21 tablet    Refill:  0   methylPREDNISolone  (MEDROL  DOSEPAK) 4 MG TBPK tablet    Sig: Take as directed on packet    Dispense:  21 tablet    Refill:  0    I have reviewed the  patients home medicines and have made adjustments as needed  Problem List / ED Course: Problem List Items Addressed This Visit   None Visit Diagnoses       Cervical radiculopathy    -  Primary                Final diagnoses:  Cervical radiculopathy    ED Discharge Orders          Ordered    methylPREDNISolone  (MEDROL  DOSEPAK) 4 MG TBPK tablet  Status:  Discontinued        09/07/23 0312    methylPREDNISolone  (MEDROL  DOSEPAK) 4 MG TBPK tablet        09/07/23 0317               Bari Charmaine FALCON, MD 09/07/23 [id number]
